# Patient Record
Sex: Male | Born: 1995 | Race: White | Hispanic: No | Marital: Single | Smoking: Never smoker
Health system: Southern US, Community
[De-identification: ages and names within clinical notes are randomized; demographics above are authoritative.]

---
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
## 2015-03-18 ENCOUNTER — Ambulatory Visit (INDEPENDENT_AMBULATORY_CARE_PROVIDER_SITE_OTHER): Payer: BLUE CROSS/BLUE SHIELD | Admitting: Family Medicine

## 2015-03-18 VITALS — BP 112/84 | HR 70 | Temp 98.0°F | Resp 16 | Ht 75.0 in | Wt 234.0 lb

## 2015-03-18 DIAGNOSIS — R41840 Attention and concentration deficit: Secondary | ICD-10-CM

## 2015-03-18 DIAGNOSIS — R002 Palpitations: Secondary | ICD-10-CM

## 2015-03-18 DIAGNOSIS — Z23 Encounter for immunization: Secondary | ICD-10-CM

## 2015-03-18 LAB — TSH: TSH: 2.78 u[IU]/mL (ref 0.350–4.500)

## 2015-03-18 NOTE — Progress Notes (Addendum)
Subjective:    Patient ID: Dennis Parker, male    DOB: 03/09/1996, 19 y.o.   MRN: [id number] By signing my name below, I, Dennis Parker, attest that this documentation has been prepared under the direction and in the presence of Meredith StaggersJeffrey Anthem Frazer, MD. Electronically Signed: Javier Dockerobert Ryan Parker, ER Scribe. 03/18/2015  8:58 AM.  Chief Complaint  Patient presents with  . Attention Problems    Been having trouble focusing  . Flu Vaccine   HPI HPI Comments: Dennis PraderLucas T Parker is a 19 y.o. male who [facility] complaining of attention problems for the last six months to a year. 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 He is having trouble focusing on his school work. He also states he occationally has trouble focusing during conversations; his mind wanders off. His grades were decent during his first two semesters, and his grades in high school were good. He was in IB in high school, and states he never had to study during high school because it was so easy. He went to [employer]. He denies social anxiety or depression sx. He denies drug use. He drinks one drink per week. He has noticed these sx before but has never been checked for ADD, or ADHD. He took an adderoll at the beginning of the semester and it was too strong for him. The tranxition to college from high school has been pretty good.  He states he continues to have heart palpitations every few months. He denies CP during or after. His sx happen randomly, sometimes while working out and sometimes just while he is walking around campus. When he has palpitations during working out he sits down and takes a brief break.   The pt was last seen in December 2015, by Dr. Cleta Albertsaub. Had a physical in January 2015 by Dr. Eula Listenyan Dunn. He did have episodic heart palpitations at that time and was recommended for a cardiology evaluation. He never reported for a cardiology evaluation. EKG not performed.    Patient Active Problem List   Diagnosis Date Noted  . FOOT PAIN, RIGHT 01/05/2009  . 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 ABNORMALITY OF GAIT 01/05/2009   History reviewed. No pertinent past medical history. History reviewed. No pertinent past surgical history. No Known Allergies Prior to Admission medications   Medication Sig Start Date End Date Taking? Authorizing Provider  Multiple Vitamin (MULTIVITAMIN) tablet Take 1 tablet by mouth daily. Reported on 03/18/2015    Historical Provider, MD   Social History   Social History  . Marital Status: Single    Spouse Name: N/A  . Number of Children: N/A  . Years of Education: N/A   Occupational History  . Not on file.   Social History Main Topics  . Smoking status: Never Smoker   . Smokeless tobacco: Not on file  . Alcohol Use: No  . Drug Use: Not on file  . Sexual Activity: Not on file   Other Topics Concern  . Not on file   Social History Narrative    Review of Systems  Constitutional: Negative for fever and chills.  Psychiatric/Behavioral: Positive for decreased concentration. Negative for dysphoric mood. The patient is not nervous/anxious.        Objective:  BP 112/84 mmHg  Pulse 70  Temp(Src) 98 F (36.7 C) (Oral)  Resp 16  Ht 6\' 3"  (1.905 m)  Wt 234 lb (106.142 kg)  BMI  29.25 kg/m2  SpO2 98%  Physical Exam  Constitutional: He is oriented to person, place, and time. He appears well-developed and well-nourished. No distress.  HENT:  Head: Normocephalic and atraumatic.  Eyes: Pupils are equal, round, and reactive to light.  Neck: Neck supple.  Cardiovascular: Normal rate, regular rhythm and normal heart sounds.   No murmur heard. Pulmonary/Chest: Effort normal and breath sounds normal. No respiratory distress.  Musculoskeletal: Normal range of motion.  Neurological: He is alert and oriented to person, place, and time. Coordination normal.  Skin: Skin is warm and dry. He is not diaphoretic.    Psychiatric: He has a normal mood and affect. His behavior is normal.  Nursing note and vitals reviewed.  EKG read by Dr. Neva Seat: sinus rhythm, no acute findings.     Assessment & Plan:  Dennis Parker is a 19 y.o. male Concentration deficit  - Did well through high school and initial semester at college. Denies social anxiety, anxiety, depression symptoms. Possible attention deficit. Advised to have formal testing done, phone numbers provided below. With his history of palpitations, TSH and EKG were obtained today. No concerning findings on EKG. If we do end up using stimulant medication and his palpitations increase, advised he will need to stop medication and seek cardiology evaluation. Understanding expressed.  Palpitations - Plan: TSH, EKG 12-Lead  - As above. RTC precautions.  Encounter for immunization  -flu Vaccine given.  No orders of the defined types were placed in this encounter.   Patient Instructions  Call one of the providers below to have formal ADD testing. Once I have those results we can discuss medication if needed.  If your heart palpitations increase or worsen in any way, recommend you be seen by a cardiologist.  Return to the clinic or go to the nearest emergency room if any of your symptoms worsen or new symptoms occur.   [facility] ADHD clinic: [address] [place], [place] [address]  Phone: [phone]   [facility]: [phone]  [facility]: [phone]   Palpitations A palpitation is the feeling that your heartbeat is irregular or is faster than normal. It may feel like your heart is fluttering or skipping a beat. Palpitations are usually not a serious problem. However, in some cases, you may need further medical evaluation. CAUSES  Palpitations can be caused by:  Smoking.  Caffeine or other stimulants, such as diet pills or energy drinks.  Alcohol.  Stress and anxiety.  Strenuous physical  activity.  Fatigue.  Certain medicines.  Heart disease, especially if you have a history of irregular heart rhythms (arrhythmias), such as atrial fibrillation, atrial flutter, or supraventricular tachycardia.  An improperly working pacemaker or defibrillator. DIAGNOSIS  To find the cause of your palpitations, your health care provider will take your medical history and perform a physical exam. Your health care provider may also have you take a test called an ambulatory electrocardiogram (ECG). An ECG records your heartbeat patterns over a 24-hour period. You may also have other tests, such as:  Transthoracic echocardiogram (TTE). During echocardiography, sound waves are used to evaluate how blood flows through your heart.  Transesophageal echocardiogram (TEE).  Cardiac monitoring. This allows your health care provider to monitor your heart rate and rhythm in real time.  Holter monitor. This is a portable device that records your heartbeat and can help diagnose heart arrhythmias. It allows your health care provider to track your heart activity for several days, if needed.  Stress tests  by exercise or by giving medicine that makes the heart beat faster. TREATMENT  Treatment of palpitations depends on the cause of your symptoms and can vary greatly. Most cases of palpitations do not require any treatment other than time, relaxation, and monitoring your symptoms. Other causes, such as atrial fibrillation, atrial flutter, or supraventricular tachycardia, usually require further treatment. HOME CARE INSTRUCTIONS   Avoid:  Caffeinated coffee, tea, soft drinks, diet pills, and energy drinks.  Chocolate.  Alcohol.  Stop smoking if you smoke.  Reduce your stress and anxiety. Things that can help you relax include:  A method of controlling things in your body, such as your heartbeats, with your mind (biofeedback).  Yoga.  Meditation.  Physical activity such as swimming, jogging, or  walking.  Get plenty of rest and sleep. SEEK MEDICAL CARE IF:   You continue to have a fast or irregular heartbeat beyond 24 hours.  Your palpitations occur more often. SEEK IMMEDIATE MEDICAL CARE IF:  You have chest pain or shortness of breath.  You have a severe headache.  You feel dizzy or you faint. MAKE SURE YOU:  Understand these instructions.  Will watch your condition.  Will get help right away if you are not doing well or get worse.   This information is not intended to replace advice given to you by your health care provider. Make sure you discuss any questions you have with your health care provider.   Document Released: 03/03/2000 Document Revised: 03/11/2013 Document Reviewed: 05/05/2011 Elsevier Interactive Patient Education [employer]     I personally performed the services described in this documentation, which was scribed in my presence. The recorded information has been reviewed and considered, and addended by me as needed.

## 2015-03-18 NOTE — Patient Instructions (Addendum)
Call one of the providers below to have formal ADD testing. Once I have those results we can discuss medication if needed.  If your heart palpitations increase or worsen in any way, recommend you be seen by a cardiologist.  Return to the clinic or go to the nearest emergency room if any of your symptoms worsen or new symptoms occur.   [facility] ADHD clinic: [address], [place], [place] [address]  Phone: [phone]   [facility]: [phone]  [facility]: [phone]   Palpitations A palpitation is the feeling that your heartbeat is irregular or is faster than normal. It may feel like your heart is fluttering or skipping a beat. Palpitations are usually not a serious problem. However, in some cases, you may need further medical evaluation. CAUSES  Palpitations can be caused by:  Smoking.  Caffeine or other stimulants, such as diet pills or energy drinks.  Alcohol.  Stress and anxiety.  Strenuous physical activity.  Fatigue.  Certain medicines.  Heart disease, especially if you have a history of irregular heart rhythms (arrhythmias), such as atrial fibrillation, atrial flutter, or supraventricular tachycardia.  An improperly working pacemaker or defibrillator. DIAGNOSIS  To find the cause of your palpitations, your health care provider will take your medical history and perform a physical exam. Your health care provider may also have you take a test called an ambulatory electrocardiogram (ECG). An ECG records your heartbeat patterns over a 24-hour period. You may also have other tests, such as:  Transthoracic echocardiogram (TTE). During echocardiography, sound waves are used to evaluate how blood flows through your heart.  Transesophageal echocardiogram (TEE).  Cardiac monitoring. This allows your health care provider to monitor your heart rate and rhythm in real time.  Holter monitor. This is a portable device that records your heartbeat  and can help diagnose heart arrhythmias. It allows your health care provider to track your heart activity for several days, if needed.  Stress tests by exercise or by giving medicine that makes the heart beat faster. TREATMENT  Treatment of palpitations depends on the cause of your symptoms and can vary greatly. Most cases of palpitations do not require any treatment other than time, relaxation, and monitoring your symptoms. Other causes, such as atrial fibrillation, atrial flutter, or supraventricular tachycardia, usually require further treatment. HOME CARE INSTRUCTIONS   Avoid:  Caffeinated coffee, tea, soft drinks, diet pills, and energy drinks.  Chocolate.  Alcohol.  Stop smoking if you smoke.  Reduce your stress and anxiety. Things that can help you relax include:  A method of controlling things in your body, such as your heartbeats, with your mind (biofeedback).  Yoga.  Meditation.  Physical activity such as swimming, jogging, or walking.  Get plenty of rest and sleep. SEEK MEDICAL CARE IF:   You continue to have a fast or irregular heartbeat beyond 24 hours.  Your palpitations occur more often. SEEK IMMEDIATE MEDICAL CARE IF:  You have chest pain or shortness of breath.  You have a severe headache.  You feel dizzy or you faint. MAKE SURE YOU:  Understand these instructions.  Will watch your condition.  Will get help right away if you are not doing well or get worse.   This information is not intended to replace advice given to you by your health care provider. Make sure you discuss any questions you have with your health care provider.   Document Released: 03/03/2000 Document Revised: 03/11/2013 Document Reviewed: 05/05/2011 Elsevier Interactive Patient Education 2016  [employer].

## 2015-08-05 ENCOUNTER — Telehealth: Payer: Self-pay

## 2015-08-05 NOTE — Telephone Encounter (Signed)
Pt called for lab results and he was informed

## 2018-03-26 ENCOUNTER — Encounter (HOSPITAL_COMMUNITY): Payer: Self-pay | Admitting: Emergency Medicine

## 2018-03-26 ENCOUNTER — Emergency Department (HOSPITAL_COMMUNITY): Payer: No Typology Code available for payment source

## 2018-03-26 ENCOUNTER — Emergency Department (HOSPITAL_COMMUNITY)
Admission: EM | Admit: 2018-03-26 | Discharge: 2018-03-27 | Disposition: A | Discharge status: Home / Self Care | Payer: No Typology Code available for payment source | Attending: Emergency Medicine | Admitting: Emergency Medicine

## 2018-03-26 DIAGNOSIS — R0789 Other chest pain: Secondary | ICD-10-CM | POA: Diagnosis present

## 2018-03-26 DIAGNOSIS — M25561 Pain in right knee: Secondary | ICD-10-CM | POA: Insufficient documentation

## 2018-03-26 DIAGNOSIS — M546 Pain in thoracic spine: Secondary | ICD-10-CM
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
## 2018-03-26 NOTE — ED Notes (Signed)
Patient transported to X-ray 

## 2018-03-27 MED ORDER — IBUPROFEN 800 MG PO TABS: 800.0000 mg | ORAL_TABLET | Freq: Three times a day (TID) | ORAL | 0 refills | Status: DC

## 2018-03-27 MED ORDER — METHOCARBAMOL 500 MG PO TABS: 500.0000 mg | ORAL_TABLET | Freq: Two times a day (BID) | ORAL | 0 refills | Status: DC

## 2018-03-27 NOTE — Discharge Instructions (Signed)
Take the prescribed medication as directed.  Can use heat/ice on affected areas.   Can also mix tylenol in between doses of medicine if need-- can take up to 2g (2000mg ) daily. Follow-up with your primary care doctor. Return to the ED for new or worsening symptoms.

## 2018-03-28 ENCOUNTER — Ambulatory Visit: Payer: Self-pay | Admitting: *Deleted
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
## 2018-03-30 ENCOUNTER — Other Ambulatory Visit: Payer: Self-pay

## 2018-03-30 ENCOUNTER — Ambulatory Visit (INDEPENDENT_AMBULATORY_CARE_PROVIDER_SITE_OTHER): Payer: 59 | Admitting: Osteopathic Medicine

## 2018-03-30 ENCOUNTER — Encounter: Payer: Self-pay | Admitting: Osteopathic Medicine

## 2018-03-30 VITALS — BP 110/68 | HR 72 | Temp 98.8°F | Ht 75.0 in | Wt 241.8 lb

## 2018-03-30 DIAGNOSIS — S2000XA Contusion of breast, unspecified breast, initial encounter: Secondary | ICD-10-CM

## 2018-03-30 DIAGNOSIS — N6489 Other specified disorders of breast: Secondary | ICD-10-CM | POA: Diagnosis not present
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
## 2018-03-30 NOTE — Patient Instructions (Addendum)
Will monitor Apply ice few times per day Continue pain medications as directed  If worse, please come see [place]!         If you have lab work done today you will be contacted with your lab results within the next 2 weeks.  If you have not heard from [place] then please contact us. The fastest way to get your results is to register for My Chart.   IF you received an x-ray today, you will receive an invoice from [facility]. Please contact [facility] at [phone] with questions or concerns regarding your invoice.   IF you received labwork today, you will receive an invoice from [place]. Please contact [facility] at [phone] with questions or concerns regarding your invoice.   Our billing staff will not be able to assist you with questions regarding bills from these companies.  You will be contacted with the lab results as soon as they are available. The fastest way to get your results is to activate your My Chart account. Instructions are located on the last page of this paperwork. If you have not heard from [place] regarding the results in 2 weeks, please contact this office.
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
